# Patient Record
Sex: Male | Born: 1995 | Race: White | Hispanic: No | Marital: Single | Smoking: Never smoker
Health system: Southern US, Community
[De-identification: ages and names within clinical notes are randomized; demographics above are authoritative.]

## PROBLEM LIST (undated history)

## (undated) DIAGNOSIS — J45909 Unspecified asthma, uncomplicated: Secondary | ICD-10-CM

## (undated) HISTORY — PX: HERNIA REPAIR: SHX51

---
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
## 2019-01-27 ENCOUNTER — Other Ambulatory Visit: Payer: Self-pay

## 2019-01-27 DIAGNOSIS — Z20822 Contact with and (suspected) exposure to covid-19: Secondary | ICD-10-CM

## 2019-01-29 LAB — NOVEL CORONAVIRUS, NAA: SARS-CoV-2, NAA: NOT DETECTED

## 2019-03-27 ENCOUNTER — Other Ambulatory Visit: Payer: Self-pay

## 2019-03-27 DIAGNOSIS — Z20822 Contact with and (suspected) exposure to covid-19: Secondary | ICD-10-CM

## 2019-03-29 LAB — NOVEL CORONAVIRUS, NAA: SARS-CoV-2, NAA: NOT DETECTED
